# Patient Record
Sex: Female | Born: 1965 | Race: White | Hispanic: No | Marital: Married | Smoking: Never smoker
Health system: Southern US, Community
[De-identification: ages and names within clinical notes are randomized; demographics above are authoritative.]

## PROBLEM LIST (undated history)

## (undated) DIAGNOSIS — N393 Stress incontinence (female) (male): Secondary | ICD-10-CM

## (undated) DIAGNOSIS — F419 Anxiety disorder, unspecified: Secondary | ICD-10-CM

## (undated) DIAGNOSIS — I10 Essential (primary) hypertension: Secondary | ICD-10-CM

## (undated) HISTORY — PX: BREAST EXCISIONAL BIOPSY: SUR124

---

## 1995-10-28 HISTORY — PX: BREAST SURGERY: SHX581

## 2004-05-31 ENCOUNTER — Encounter: Admission: RE | Admit: 2004-05-31 | Discharge: 2004-05-31 | Payer: Self-pay | Admitting: Obstetrics and Gynecology

## 2004-11-12 ENCOUNTER — Other Ambulatory Visit: Admission: RE | Admit: 2004-11-12 | Discharge: 2004-11-12 | Payer: Self-pay | Admitting: Obstetrics and Gynecology

## 2004-11-13 ENCOUNTER — Encounter: Admission: RE | Admit: 2004-11-13 | Discharge: 2004-11-13 | Payer: Self-pay | Admitting: Obstetrics and Gynecology

## 2005-11-13 ENCOUNTER — Encounter: Payer: Self-pay | Admitting: Obstetrics and Gynecology

## 2005-11-13 ENCOUNTER — Encounter: Admission: RE | Admit: 2005-11-13 | Discharge: 2005-11-13 | Payer: Self-pay | Admitting: Obstetrics and Gynecology

## 2005-11-13 ENCOUNTER — Other Ambulatory Visit: Admission: RE | Admit: 2005-11-13 | Discharge: 2005-11-13 | Payer: Self-pay | Admitting: Obstetrics and Gynecology

## 2005-11-18 ENCOUNTER — Encounter: Admission: RE | Admit: 2005-11-18 | Discharge: 2005-11-18 | Payer: Self-pay | Admitting: Obstetrics and Gynecology

## 2006-12-01 ENCOUNTER — Encounter: Admission: RE | Admit: 2006-12-01 | Discharge: 2006-12-01 | Payer: Self-pay | Admitting: Obstetrics and Gynecology

## 2006-12-01 ENCOUNTER — Other Ambulatory Visit: Admission: RE | Admit: 2006-12-01 | Discharge: 2006-12-01 | Payer: Self-pay | Admitting: Obstetrics and Gynecology

## 2006-12-15 ENCOUNTER — Encounter: Admission: RE | Admit: 2006-12-15 | Discharge: 2006-12-15 | Payer: Self-pay | Admitting: Obstetrics and Gynecology

## 2007-05-10 ENCOUNTER — Encounter: Admission: RE | Admit: 2007-05-10 | Discharge: 2007-05-10 | Payer: Self-pay | Admitting: Obstetrics and Gynecology

## 2007-11-11 ENCOUNTER — Encounter: Admission: RE | Admit: 2007-11-11 | Discharge: 2007-11-11 | Payer: Self-pay | Admitting: Obstetrics and Gynecology

## 2007-12-14 ENCOUNTER — Other Ambulatory Visit: Admission: RE | Admit: 2007-12-14 | Discharge: 2007-12-14 | Payer: Self-pay | Admitting: Obstetrics and Gynecology

## 2008-11-13 ENCOUNTER — Encounter: Admission: RE | Admit: 2008-11-13 | Discharge: 2008-11-13 | Payer: Self-pay | Admitting: Obstetrics and Gynecology

## 2008-12-28 ENCOUNTER — Other Ambulatory Visit: Admission: RE | Admit: 2008-12-28 | Discharge: 2008-12-28 | Payer: Self-pay | Admitting: Obstetrics and Gynecology

## 2009-11-14 ENCOUNTER — Encounter: Admission: RE | Admit: 2009-11-14 | Discharge: 2009-11-14 | Payer: Self-pay | Admitting: Obstetrics and Gynecology

## 2010-01-01 ENCOUNTER — Other Ambulatory Visit: Admission: RE | Admit: 2010-01-01 | Discharge: 2010-01-01 | Payer: Self-pay | Admitting: Obstetrics and Gynecology

## 2010-11-15 ENCOUNTER — Encounter
Admission: RE | Admit: 2010-11-15 | Discharge: 2010-11-15 | Payer: Self-pay | Source: Home / Self Care | Attending: Obstetrics and Gynecology | Admitting: Obstetrics and Gynecology

## 2010-11-17 ENCOUNTER — Encounter: Payer: Self-pay | Admitting: Obstetrics and Gynecology

## 2012-02-16 ENCOUNTER — Encounter (HOSPITAL_BASED_OUTPATIENT_CLINIC_OR_DEPARTMENT_OTHER): Payer: Self-pay | Admitting: *Deleted

## 2012-02-16 ENCOUNTER — Emergency Department (HOSPITAL_BASED_OUTPATIENT_CLINIC_OR_DEPARTMENT_OTHER)
Admission: EM | Admit: 2012-02-16 | Discharge: 2012-02-16 | Disposition: A | Discharge status: Home / Self Care | Payer: BC Managed Care – PPO | Attending: Emergency Medicine | Admitting: Emergency Medicine

## 2012-02-16 DIAGNOSIS — M79609 Pain in unspecified limb: Secondary | ICD-10-CM | POA: Insufficient documentation

## 2012-02-16 DIAGNOSIS — W260XXA Contact with knife, initial encounter: Secondary | ICD-10-CM | POA: Insufficient documentation

## 2012-02-16 DIAGNOSIS — IMO0002 Reserved for concepts with insufficient information to code with codable children: Secondary | ICD-10-CM

## 2012-02-16 DIAGNOSIS — F411 Generalized anxiety disorder: Secondary | ICD-10-CM | POA: Insufficient documentation

## 2012-02-16 DIAGNOSIS — S61209A Unspecified open wound of unspecified finger without damage to nail, initial encounter: Secondary | ICD-10-CM | POA: Insufficient documentation

## 2012-02-16 HISTORY — DX: Anxiety disorder, unspecified: F41.9

## 2012-02-16 MED ORDER — TETANUS-DIPHTH-ACELL PERTUSSIS 5-2.5-18.5 LF-MCG/0.5 IM SUSP
0.5000 mL | Freq: Once | INTRAMUSCULAR | Status: AC
  Administered 2012-02-16: 0.5 mL via INTRAMUSCULAR
  Filled 2012-02-16: qty 0.5

## 2012-02-16 MED ORDER — LIDOCAINE HCL (PF) 1 % IJ SOLN
INTRAMUSCULAR | Status: AC
  Administered 2012-02-16: 08:00:00
  Filled 2012-02-16: qty 5

## 2012-02-16 MED ORDER — OXYCODONE-ACETAMINOPHEN 5-325 MG PO TABS
1.0000 | ORAL_TABLET | Freq: Once | ORAL | Status: AC
  Administered 2012-02-16: 1 via ORAL

## 2012-02-16 MED ORDER — LIDOCAINE HCL (PF) 1 % IJ SOLN: 2.0000 mL | Freq: Once | INTRAMUSCULAR | Status: DC

## 2012-02-16 MED ORDER — OXYCODONE-ACETAMINOPHEN 5-325 MG PO TABS
ORAL_TABLET | ORAL | Status: AC
  Administered 2012-02-16: 1 via ORAL
  Filled 2012-02-16: qty 1

## 2012-02-16 NOTE — ED Provider Notes (Signed)
History     CSN: [id number]  Arrival date & time 02/16/12  4098   First MD Initiated Contact with Patient 02/16/12 0732      Chief Complaint  Patient presents with  . Laceration    Patient is a 46 y.o. female presenting with skin laceration. The history is provided by the patient.  Laceration  The incident occurred 1 to 2 hours ago. Pain location: left thumb. The laceration is 1 cm in size. The laceration mechanism was a a clean knife. The pain is moderate. The pain has been constant since onset. Her tetanus status is unknown.  pt cut left thumb with knife while cutting bagel Reports bleeding wound and pain in the wound No other complaints are reported  Past Medical History  Diagnosis Date  . Anxiety     History reviewed. No pertinent past surgical history.  History reviewed. No pertinent family history.  History  Substance Use Topics  . Smoking status: Never Smoker   . Smokeless tobacco: Not on file  . Alcohol Use: 1.2 oz/week    2 Glasses of wine per week     per day    OB History    Grav Para Term Preterm Abortions TAB SAB Ect Mult Living                  Review of Systems  Constitutional: Negative for fever.  Skin: Positive for wound.    Allergies  Review of patient's allergies indicates no known allergies.  Home Medications   Current Outpatient Rx  Name Route Sig Dispense Refill  . VENLAFAXINE HCL ER 150 MG PO CP24 Oral Take 150 mg by mouth daily.      BP 142/91  Pulse 84  Temp(Src) 98 F (36.7 C) (Oral)  Resp 20  Ht 5\' 9"  (1.753 m)  Wt 145 lb (65.772 kg)  BMI 21.41 kg/m2  SpO2 100%  LMP 02/15/2012  Physical Exam CONSTITUTIONAL: Well developed/well nourished HEAD AND FACE: Normocephalic/atraumatic EYES: EOMI/PERRL ENMT: Mucous membranes moist NECK: supple no meningeal signs LUNGS:  no apparent distress ABDOMEN: soft NEURO: Pt is awake/alert, moves all extremitiesx4 EXTREMITIES: pulses normal, full ROM SKIN: warm, color normal,  laceration to distal tip of left thumb.  The nail also been disrupted but there is no subungual hematoma.  No active bleeding PSYCH: no abnormalities of mood noted  ED Course  NERVE BLOCK Date/Time: 02/16/2012 8:04 AM Performed by: Joya Gaskins Authorized by: Joya Gaskins Consent: Verbal consent obtained. Consent given by: patient Patient identity confirmed: verbally with patient Indications: pain relief Body area: upper extremity Nerve: digital Laterality: left Patient sedated: no Preparation: Patient was prepped and draped in the usual sterile fashion. Needle gauge: 24 G Local anesthetic: lidocaine 1% without epinephrine Anesthetic total: 3 ml Patient tolerance: Patient tolerated the procedure well with no immediate complications.    LACERATION REPAIR Performed by: Joya Gaskins Consent: Verbal consent obtained. Risks and benefits: risks, benefits and alternatives were discussed Patient identity confirmed: provided demographic data Time out performed prior to procedure Prepped and Draped in normal sterile fashion Wound explored  Laceration Location: left thumb  Laceration Length: 1cm  No Foreign Bodies seen or palpated  Anesthesia: local infiltration  Local anesthetic:nerveblock  Irrigation method: tap water Amount of cleaning: standard  Skin closure: simple  Number of sutures or staples: dermabond  Technique: dermabond  Patient tolerance: Patient tolerated the procedure well with no immediate complications.     MDM  Nursing notes reviewed and  considered in documentation         Joya Gaskins, MD 02/16/12 [id number]

## 2012-02-16 NOTE — ED Notes (Signed)
Cutting a bagel with a serrated knife cut finger

## 2012-02-16 NOTE — Discharge Instructions (Signed)
Laceration Care, Adult °A laceration is a cut that goes through all layers of the skin. The cut goes into the tissue beneath the skin. °HOME CARE °For stitches (sutures) or staples: °· Keep the cut clean and dry.  °· If you have a bandage (dressing), change it at least once a day. Change the bandage if it gets wet or dirty, or as told by your doctor.  °· Wash the cut with soap and water 2 times a day. Rinse the cut with water. Pat it dry with a clean towel.  °· Put a thin layer of medicated cream on the cut as told by your doctor.  °· You may shower after the first 24 hours. Do not soak the cut in water until the stitches are removed.  °· Only take medicines as told by your doctor.  °· Have your stitches or staples removed as told by your doctor.  °For skin adhesive strips: °· Keep the cut clean and dry.  °· Do not get the strips wet. You may take a bath, but be careful to keep the cut dry.  °· If the cut gets wet, pat it dry with a clean towel.  °· The strips will fall off on their own. Do not remove the strips that are still stuck to the cut.  °For wound glue: °· You may shower or take baths. Do not soak or scrub the cut. Do not swim. Avoid heavy sweating until the glue falls off on its own. After a shower or bath, pat the cut dry with a clean towel.  °· Do not put medicine on your cut until the glue falls off.  °· If you have a bandage, do not put tape over the glue.  °· Avoid lots of sunlight or tanning lamps until the glue falls off. Put sunscreen on the cut for the first year to reduce your scar.  °· The glue will fall off on its own. Do not pick at the glue.  °You may need a tetanus shot if: °· You cannot remember when you had your last tetanus shot.  °· You have never had a tetanus shot.  °If you need a tetanus shot and you choose not to have one, you may get tetanus. Sickness from tetanus can be serious. °GET HELP RIGHT AWAY IF:  °· Your pain does not get better with medicine.  °· Your arm, hand, leg, or  foot loses feeling (numbness) or changes color.  °· Your cut is bleeding.  °· Your joint feels weak, or you cannot use your joint.  °· You have painful lumps on your body.  °· Your cut is red, puffy (swollen), or painful.  °· You have a red line on the skin near the cut.  °· You have yellowish-white fluid (pus) coming from the cut.  °· You have a fever.  °· You have a bad smell coming from the cut or bandage.  °· Your cut breaks open before or after stitches are removed.  °· You notice something coming out of the cut, such as wood or glass.  °· You cannot move a finger or toe.  °MAKE SURE YOU:  °· Understand these instructions.  °· Will watch your condition.  °· Will get help right away if you are not doing well or get worse.  °Document Released: 03/31/2008 Document Revised: 10/02/2011 Document Reviewed: 04/08/2011 °ExitCare® Patient Information ©2012 [employer]. °

## 2013-02-08 ENCOUNTER — Other Ambulatory Visit: Payer: Self-pay | Admitting: Obstetrics and Gynecology

## 2013-02-08 DIAGNOSIS — R922 Inconclusive mammogram: Secondary | ICD-10-CM

## 2013-03-02 ENCOUNTER — Other Ambulatory Visit: Payer: BC Managed Care – PPO

## 2013-07-26 ENCOUNTER — Ambulatory Visit: Payer: BC Managed Care – PPO | Admitting: Family Medicine

## 2013-08-25 ENCOUNTER — Other Ambulatory Visit: Payer: Self-pay | Admitting: Dermatology

## 2014-10-16 ENCOUNTER — Encounter (HOSPITAL_BASED_OUTPATIENT_CLINIC_OR_DEPARTMENT_OTHER): Payer: Self-pay | Admitting: *Deleted

## 2014-10-17 ENCOUNTER — Encounter (HOSPITAL_BASED_OUTPATIENT_CLINIC_OR_DEPARTMENT_OTHER): Payer: Self-pay | Admitting: *Deleted

## 2014-10-17 NOTE — H&P (Signed)
  Patient name  Felicia Coleman, [place] DICTATION#  [id number] CSN# [id number]  Darlyn Chamber, MD 10/17/2014 2:50 PM

## 2014-10-17 NOTE — Progress Notes (Signed)
Pt instructed npo p mn 12/30.  To [facility] 12/31 @ 0600.  Needs istat, urine hcg on arrival. Awaiting Dr. Kayleen Memos.

## 2014-10-18 NOTE — H&P (Signed)
NAME:  Felicia Coleman, Felicia Coleman NO.:  [id number]  MEDICAL RECORD NO.:  [id number]  LOCATION:                                FACILITY:  [facility]  PHYSICIAN:  Darlyn Chamber, M.D.   DATE OF BIRTH:  [birth year]  DATE OF ADMISSION:  10/26/2014 DATE OF DISCHARGE:                             HISTORY & PHYSICAL   DATE OF SURGERY:  December 31st, at [facility] in [place].  HISTORY OF PRESENT ILLNESS:  The patient is a 48 year old, gravida 2, para 2 female, who comes in for a mid urethral sling for management of stress incontinence.  The patient has had trouble with worsening stress urinary incontinence when she cough, sneeze, or bears down.  This also limited her running.  The patient underwent urodynamic testing in the office showed a residual urine of 1-2 cm.  The patient did leak when coughing and sneezing.  She had normal leak point pressures.  Her urethral pressure profile was also normal.  There was no evidence of uninhibited bladder contractions.  We went over different options including physical therapy versus surgical management.  She has opted for surgery for which she comes in at the present time.  ALLERGIES:  She has no known drug allergies.  MEDICATIONS:  She is on venlafaxine 35 mg.  PAST MEDICAL HISTORY:  Usual childhood diseases.  No significant sequelae.  PAST SURGICAL HISTORY:  She has had a cesarean section and one vaginal delivery.  SOCIAL HISTORY:  Reveals no tobacco and minimal alcohol use.  FAMILY HISTORY:  Noncontributory.  REVIEW OF SYSTEMS:  Noncontributory.  PHYSICAL EXAMINATION:  VITAL SIGNS:  The patient is afebrile.  Stable vital signs. HEENT:  The patient is normocephalic.  Pupils equal, round, reactive to light and accommodation.  Extraocular movements were intact.  Sclerae and conjunctivae were clear.  Oropharynx is clear. NECK:  Without thyromegaly. BREASTS:  Not examined. LUNGS:  Clear. CARDIOVASCULAR SYSTEM:   Regular rhythm and rate.  There are no murmurs or gallops. ABDOMEN:  Benign.  No mass, organomegaly, or tenderness. PELVIC:  Normal external genitalia.  Vaginal mucosa is clear.  Mild cystourethrocele.  Cervix unremarkable.  Uterus; normal size, shape, and contour.  Adnexa free of mass or tenderness.  IMPRESSION:  Anatomical stress urinary incontinence.  PLAN:  The patient will undergo a mid urethral sling using a transobturator approach.  The success rates of 85% are quoted. Potential risks explained including the risk of infection and risk of hemorrhage that could require transfusion with the risk of AIDS or hepatitis, risk of injury to adjacent organs, this could include bladder, urethral, or ureters that could require further exploratory surgery.  Risk of deep venous thrombosis and pulmonary embolus.  With mesh, there is a risk of erosion that could require further surgical management.  There is a risk of mesh rejection leading to chronic pelvic pain, the risk of developing insertional dyspareunia.  Finally, if we overtighten the mesh, this can lead to an obstructive voiding pattern which will require loosening of the mesh that could have a return of incontinence.  Lastly, there is a risk of bladder spasms that require medical therapy.  The patient  expressed understanding of indications, risks, and other alternatives.     Darlyn Chamber, M.D.     JSM/MEDQ  D:  10/17/2014  T:  10/17/2014  Job:  [id number]

## 2014-10-26 ENCOUNTER — Ambulatory Visit (HOSPITAL_BASED_OUTPATIENT_CLINIC_OR_DEPARTMENT_OTHER)
Admission: RE | Admit: 2014-10-26 | Payer: BC Managed Care – PPO | Source: Ambulatory Visit | Admitting: Obstetrics and Gynecology

## 2014-10-26 HISTORY — DX: Essential (primary) hypertension: I10

## 2014-10-26 HISTORY — DX: Stress incontinence (female) (male): N39.3

## 2014-10-26 SURGERY — CREATION, PUBOVAGINAL SLING
Anesthesia: Choice

## 2015-04-11 ENCOUNTER — Other Ambulatory Visit: Payer: Self-pay | Admitting: Obstetrics and Gynecology

## 2015-04-12 LAB — CYTOLOGY - PAP

## 2018-08-11 ENCOUNTER — Other Ambulatory Visit: Payer: Self-pay | Admitting: Obstetrics and Gynecology

## 2018-08-11 DIAGNOSIS — Z803 Family history of malignant neoplasm of breast: Secondary | ICD-10-CM

## 2019-08-04 ENCOUNTER — Other Ambulatory Visit: Payer: Self-pay | Admitting: Obstetrics and Gynecology

## 2019-08-04 DIAGNOSIS — R928 Other abnormal and inconclusive findings on diagnostic imaging of breast: Secondary | ICD-10-CM

## 2019-08-15 ENCOUNTER — Ambulatory Visit: Payer: BC Managed Care – PPO

## 2019-08-15 ENCOUNTER — Other Ambulatory Visit: Payer: Self-pay

## 2019-08-15 ENCOUNTER — Ambulatory Visit
Admission: RE | Admit: 2019-08-15 | Discharge: 2019-08-15 | Disposition: A | Discharge status: Home / Self Care | Payer: BC Managed Care – PPO | Source: Ambulatory Visit | Attending: Obstetrics and Gynecology | Admitting: Obstetrics and Gynecology

## 2019-08-15 DIAGNOSIS — R928 Other abnormal and inconclusive findings on diagnostic imaging of breast: Secondary | ICD-10-CM

## 2019-11-08 ENCOUNTER — Other Ambulatory Visit: Payer: Self-pay

## 2019-11-08 ENCOUNTER — Ambulatory Visit: Payer: BC Managed Care – PPO | Admitting: Plastic Surgery

## 2019-11-08 ENCOUNTER — Encounter: Payer: Self-pay | Admitting: Plastic Surgery

## 2019-11-08 DIAGNOSIS — D2230 Melanocytic nevi of unspecified part of face: Secondary | ICD-10-CM

## 2019-11-08 NOTE — Progress Notes (Signed)
Patient ID: Felicia Coleman, female    DOB: 1966-02-05, 55 y.o.   MRN: [id number]   Chief Complaint  Patient presents with  . Skin Problem    The patient is a 54 year old female here with her husband for evaluation of her nose.  She has a history of skin lesions.  She sees the dermatologist regularly.  She was seen recently and concerned about a lesion on her nose.  Is located at the dorsum and is a little irritated.  This may be related to rubbing from the mask.  There is a little bit of redness and a little ulceration in the middle of it.  It is about 7 to 8 mm in size.  Does not appear to be infected just irritated.  It is hard to tell anything about the color since it is red at this time.  She is otherwise in good health.   Review of Systems  Constitutional: Negative.  Negative for activity change.  HENT: Negative.   Eyes: Negative.   Respiratory: Negative.  Negative for chest tightness.   Cardiovascular: Negative.   Gastrointestinal: Negative.   Genitourinary: Negative.   Musculoskeletal: Negative.   Skin: Positive for color change.  Hematological: Negative.   Psychiatric/Behavioral: Negative.     Past Medical History:  Diagnosis Date  . Anxiety   . Hypertension   . SUI (stress urinary incontinence, female)     Past Surgical History:  Procedure Laterality Date  . BREAST EXCISIONAL BIOPSY Right   . BREAST SURGERY  1997   rt lumpectomy      Current Outpatient Medications:  .  beta carotene w/minerals (OCUVITE) tablet, Take 1 tablet by mouth daily., Disp: , Rfl:  .  hydrochlorothiazide (HYDRODIURIL) 25 MG tablet, Take 25 mg by mouth daily., Disp: , Rfl:  .  venlafaxine XR (EFFEXOR-XR) 150 MG 24 hr capsule, Take 150 mg by mouth daily., Disp: , Rfl:    Objective:   Vitals:   11/08/19 1515  BP: (!) 156/101  Pulse: 78  Temp: 97.7 F (36.5 C)  SpO2: 99%    Physical Exam Vitals and nursing note reviewed.  Constitutional:      Appearance: Normal appearance.    HENT:     Head: Normocephalic and atraumatic.   Cardiovascular:     Rate and Rhythm: Normal rate.  Pulmonary:     Effort: Pulmonary effort is normal.  Skin:    General: Skin is warm.  Neurological:     General: No focal deficit present.     Mental Status: She is alert and oriented to person, place, and time.  Psychiatric:        Mood and Affect: Mood normal.        Behavior: Behavior normal.        Thought Content: Thought content normal.     Assessment & Plan:  Melanocytic nevus of face  Recommend biopsy of the area to be sure of what it is because of the irritation.  We discussed the options may include Mohs surgery or excision by me.  We will evaluate once we get the biopsy.  Pictures were obtained of the patient and placed in the chart with the patient's or guardian's permission.   Wallace Going, DO   The 21st Century Cures Act was signed into law in 2016 which includes the topic of electronic health records.  This provides immediate access to information in MyChart.  This includes consultation notes, operative notes, office notes,  lab results and pathology reports.  If you have any questions about what you read please let us know at your next visit or call us at the office.  We are right here with you.

## 2019-12-23 ENCOUNTER — Encounter: Payer: Self-pay | Admitting: Plastic Surgery

## 2019-12-23 ENCOUNTER — Other Ambulatory Visit (HOSPITAL_COMMUNITY)
Admission: RE | Admit: 2019-12-23 | Discharge: 2019-12-23 | Disposition: A | Discharge status: Home / Self Care | Payer: BC Managed Care – PPO | Source: Ambulatory Visit | Attending: Plastic Surgery | Admitting: Plastic Surgery

## 2019-12-23 ENCOUNTER — Ambulatory Visit (INDEPENDENT_AMBULATORY_CARE_PROVIDER_SITE_OTHER): Payer: BC Managed Care – PPO | Admitting: Plastic Surgery

## 2019-12-23 ENCOUNTER — Other Ambulatory Visit: Payer: Self-pay

## 2019-12-23 VITALS — BP 112/78 | HR 96 | Temp 97.5°F | Ht 69.0 in | Wt 176.6 lb

## 2019-12-23 DIAGNOSIS — D2230 Melanocytic nevi of unspecified part of face: Secondary | ICD-10-CM | POA: Diagnosis present

## 2019-12-23 NOTE — Progress Notes (Signed)
Procedure Note  Preoperative Dx: changing skin lesion  Postoperative Dx: Same  Procedure: Excision of changing skin lesion 6 x 6 mm  Anesthesia: Lidocaine 1% with 1:100,000 epinepherine   Description of Procedure: Risks and complications were explained to the patient.  Consent was confirmed and the patient understands the risks and benefits.  The potential complications and alternatives were explained and the patient consents.  The patient expressed understanding the option of not having the procedure and the risks of a scar.  Time out was called and all information was confirmed to be correct.    The area was prepped and drapped.  Lidocaine 1% with epinepherine was injected in the subcutaneous area.  After waiting several minutes for the local to take affect a #15 blade was used to excise the area in an eliptical pattern.  A 5-0 Monocryl was used to close the skin edges.  A dressing was applied.  The patient was given instructions on how to care for the area and a follow up appointment.  Felicia Coleman tolerated the procedure well and there were no complications. The specimen was sent to pathology.

## 2019-12-24 ENCOUNTER — Ambulatory Visit: Payer: BC Managed Care – PPO | Attending: Internal Medicine

## 2019-12-24 DIAGNOSIS — Z23 Encounter for immunization: Secondary | ICD-10-CM | POA: Insufficient documentation

## 2019-12-24 NOTE — Progress Notes (Signed)
   Covid-19 Vaccination Clinic  Name:  Felicia Coleman    MRN: [id number] DOB: 05-02-1966  12/24/2019  Felicia Coleman was observed post Covid-19 immunization for 15 minutes without incidence. She was provided with Vaccine Information Sheet and instruction to access the V-Safe system.   Felicia Coleman was instructed to call 911 with any severe reactions post vaccine: Marland Kitchen Difficulty breathing  . Swelling of your face and throat  . A fast heartbeat  . A bad rash all over your body  . Dizziness and weakness    Immunizations Administered    Name Date Dose VIS Date Route   Pfizer COVID-19 Vaccine 12/24/2019 11:05 AM 0.3 mL 10/07/2019 Intramuscular   Manufacturer: [place]   Lot: [id number]   [place]: [id number]

## 2019-12-26 LAB — SURGICAL PATHOLOGY

## 2020-01-02 NOTE — Progress Notes (Signed)
The patient is a 54 year old female here with her husband for follow-up after undergoing excision of a nasal lesion. The pathology showed a [place].  The peripheral margins are involved.  She would like to have it excised via Mohs technique.  This is a great idea.  We will make the referral.

## 2020-01-03 ENCOUNTER — Ambulatory Visit: Payer: BC Managed Care – PPO | Admitting: Plastic Surgery

## 2020-01-03 ENCOUNTER — Other Ambulatory Visit: Payer: Self-pay

## 2020-01-03 ENCOUNTER — Encounter: Payer: Self-pay | Admitting: Plastic Surgery

## 2020-01-03 DIAGNOSIS — C44311 Basal cell carcinoma of skin of nose: Secondary | ICD-10-CM

## 2020-01-14 ENCOUNTER — Ambulatory Visit: Payer: BC Managed Care – PPO | Attending: Internal Medicine

## 2020-01-14 DIAGNOSIS — Z23 Encounter for immunization: Secondary | ICD-10-CM

## 2020-01-14 NOTE — Progress Notes (Signed)
   Covid-19 Vaccination Clinic  Name:  Felicia Coleman    MRN: [id number] DOB: 28-Aug-1966  01/14/2020  [name] was observed post Covid-19 immunization for 15 minutes without incident. She was provided with Vaccine Information Sheet and instruction to access the V-Safe system.   [name] was instructed to call 911 with any severe reactions post vaccine: Marland Kitchen Difficulty breathing  . Swelling of face and throat  . A fast heartbeat  . A bad rash all over body  . Dizziness and weakness   Immunizations Administered    Name Date Dose VIS Date Route   Pfizer COVID-19 Vaccine 01/14/2020  1:21 PM 0.3 mL 10/07/2019 Intramuscular   Manufacturer: [place]   Lot: [id number]   [place]: [id number]

## 2020-01-18 ENCOUNTER — Ambulatory Visit: Payer: BC Managed Care – PPO

## 2020-02-20 ENCOUNTER — Encounter (HOSPITAL_BASED_OUTPATIENT_CLINIC_OR_DEPARTMENT_OTHER): Payer: Self-pay | Admitting: Plastic Surgery

## 2020-02-20 ENCOUNTER — Other Ambulatory Visit: Payer: Self-pay

## 2020-02-21 ENCOUNTER — Other Ambulatory Visit (HOSPITAL_COMMUNITY)
Admission: RE | Admit: 2020-02-21 | Discharge: 2020-02-21 | Disposition: A | Discharge status: Home / Self Care | Payer: BC Managed Care – PPO | Source: Ambulatory Visit | Attending: Plastic Surgery | Admitting: Plastic Surgery

## 2020-02-21 ENCOUNTER — Encounter (HOSPITAL_BASED_OUTPATIENT_CLINIC_OR_DEPARTMENT_OTHER)
Admission: RE | Admit: 2020-02-21 | Discharge: 2020-02-21 | Disposition: A | Discharge status: Home / Self Care | Payer: BC Managed Care – PPO | Source: Ambulatory Visit

## 2020-02-21 ENCOUNTER — Ambulatory Visit: Payer: BC Managed Care – PPO | Admitting: Plastic Surgery

## 2020-02-21 ENCOUNTER — Other Ambulatory Visit: Payer: Self-pay

## 2020-02-21 DIAGNOSIS — Z20822 Contact with and (suspected) exposure to covid-19: Secondary | ICD-10-CM | POA: Diagnosis not present

## 2020-02-21 DIAGNOSIS — Z01812 Encounter for preprocedural laboratory examination: Secondary | ICD-10-CM | POA: Insufficient documentation

## 2020-02-21 LAB — BASIC METABOLIC PANEL
Anion gap: 10 (ref 5–15)
BUN: 13 mg/dL (ref 6–20)
CO2: 26 mmol/L (ref 22–32)
Calcium: 9.3 mg/dL (ref 8.9–10.3)
Chloride: 102 mmol/L (ref 98–111)
Creatinine, Ser: 0.83 mg/dL (ref 0.44–1.00)
GFR calc Af Amer: >60 mL/min (ref >60)
GFR calc non Af Amer: >60 mL/min (ref >60)
Glucose, Bld: 120 mg/dL — ABNORMAL HIGH (ref 70–99)
Potassium: 4.1 mmol/L (ref 3.5–5.1)
Sodium: 138 mmol/L (ref 135–145)

## 2020-02-21 LAB — POCT PREGNANCY, URINE: Preg Test, Ur: NEGATIVE

## 2020-02-21 LAB — SARS CORONAVIRUS 2 (TAT 6-24 HRS): SARS Coronavirus 2: NEGATIVE

## 2020-02-22 ENCOUNTER — Encounter (HOSPITAL_BASED_OUTPATIENT_CLINIC_OR_DEPARTMENT_OTHER)
Admission: RE | Disposition: A | Discharge status: Home / Self Care | Payer: Self-pay | Source: Home / Self Care | Attending: Plastic Surgery

## 2020-02-22 ENCOUNTER — Other Ambulatory Visit: Payer: Self-pay

## 2020-02-22 ENCOUNTER — Ambulatory Visit (HOSPITAL_BASED_OUTPATIENT_CLINIC_OR_DEPARTMENT_OTHER): Payer: BC Managed Care – PPO | Admitting: Certified Registered"

## 2020-02-22 ENCOUNTER — Encounter (HOSPITAL_BASED_OUTPATIENT_CLINIC_OR_DEPARTMENT_OTHER): Payer: Self-pay | Admitting: Plastic Surgery

## 2020-02-22 ENCOUNTER — Ambulatory Visit (HOSPITAL_BASED_OUTPATIENT_CLINIC_OR_DEPARTMENT_OTHER)
Admission: RE | Admit: 2020-02-22 | Discharge: 2020-02-22 | Disposition: A | Discharge status: Home / Self Care | Payer: BC Managed Care – PPO | Attending: Plastic Surgery | Admitting: Plastic Surgery

## 2020-02-22 DIAGNOSIS — I10 Essential (primary) hypertension: Secondary | ICD-10-CM | POA: Insufficient documentation

## 2020-02-22 DIAGNOSIS — Z79899 Other long term (current) drug therapy: Secondary | ICD-10-CM | POA: Insufficient documentation

## 2020-02-22 DIAGNOSIS — M950 Acquired deformity of nose: Secondary | ICD-10-CM | POA: Insufficient documentation

## 2020-02-22 DIAGNOSIS — C44311 Basal cell carcinoma of skin of nose: Secondary | ICD-10-CM

## 2020-02-22 DIAGNOSIS — F419 Anxiety disorder, unspecified: Secondary | ICD-10-CM | POA: Diagnosis not present

## 2020-02-22 DIAGNOSIS — Z85828 Personal history of other malignant neoplasm of skin: Secondary | ICD-10-CM | POA: Diagnosis not present

## 2020-02-22 HISTORY — PX: ADJACENT TISSUE TRANSFER/TISSUE REARRANGEMENT: SHX6829

## 2020-02-22 SURGERY — ADJACENT TISSUE TRANSFER
Anesthesia: General | Site: Nose

## 2020-02-22 MED ORDER — OXYCODONE HCL 5 MG PO TABS: 5.0000 mg | ORAL_TABLET | ORAL | Status: DC | PRN

## 2020-02-22 MED ORDER — ONDANSETRON HCL 4 MG PO TABS: 4.0000 mg | ORAL_TABLET | Freq: Every day | ORAL | 0 refills | Status: AC | PRN

## 2020-02-22 MED ORDER — LACTATED RINGERS IV SOLN: INTRAVENOUS | Status: DC

## 2020-02-22 MED ORDER — MIDAZOLAM HCL 2 MG/2ML IJ SOLN
INTRAMUSCULAR | Status: AC
  Filled 2020-02-22: qty 2

## 2020-02-22 MED ORDER — ACETAMINOPHEN 80 MG RE SUPP: 650.0000 mg | RECTAL | Status: DC | PRN

## 2020-02-22 MED ORDER — CEFAZOLIN SODIUM-DEXTROSE 2-4 GM/100ML-% IV SOLN
2.0000 g | INTRAVENOUS | Status: AC
  Administered 2020-02-22: 15:00:00 2 g via INTRAVENOUS

## 2020-02-22 MED ORDER — MORPHINE SULFATE (PF) 4 MG/ML IV SOLN
INTRAVENOUS | Status: AC
  Filled 2020-02-22: qty 1

## 2020-02-22 MED ORDER — PROPOFOL 10 MG/ML IV BOLUS
INTRAVENOUS | Status: DC | PRN
  Administered 2020-02-22: 200 mg via INTRAVENOUS
  Administered 2020-02-22: 150 mg via INTRAVENOUS

## 2020-02-22 MED ORDER — LIDOCAINE HCL (CARDIAC) PF 100 MG/5ML IV SOSY
PREFILLED_SYRINGE | INTRAVENOUS | Status: DC | PRN
  Administered 2020-02-22: 60 mg via INTRAVENOUS

## 2020-02-22 MED ORDER — LIDOCAINE-EPINEPHRINE 1 %-1:100000 IJ SOLN
INTRAMUSCULAR | Status: DC | PRN
  Administered 2020-02-22: .5 mL

## 2020-02-22 MED ORDER — AMOXICILLIN-POT CLAVULANATE 500-125 MG PO TABS: 1.0000 | ORAL_TABLET | Freq: Three times a day (TID) | ORAL | Status: DC

## 2020-02-22 MED ORDER — MEPERIDINE HCL 25 MG/ML IJ SOLN: 6.2500 mg | INTRAMUSCULAR | Status: DC | PRN

## 2020-02-22 MED ORDER — ONDANSETRON HCL 4 MG/2ML IJ SOLN
INTRAMUSCULAR | Status: DC | PRN
  Administered 2020-02-22: 4 mg via INTRAVENOUS

## 2020-02-22 MED ORDER — SODIUM CHLORIDE 0.9% FLUSH: 3.0000 mL | Freq: Two times a day (BID) | INTRAVENOUS | Status: DC

## 2020-02-22 MED ORDER — OXYCODONE HCL 5 MG PO TABS
ORAL_TABLET | ORAL | Status: AC
  Filled 2020-02-22: qty 1

## 2020-02-22 MED ORDER — OXYCODONE HCL 5 MG PO TABS
5.0000 mg | ORAL_TABLET | Freq: Once | ORAL | Status: AC | PRN
  Administered 2020-02-22: 5 mg via ORAL

## 2020-02-22 MED ORDER — SODIUM CHLORIDE 0.9 % IV SOLN: 250.0000 mL | INTRAVENOUS | Status: DC | PRN

## 2020-02-22 MED ORDER — PROMETHAZINE HCL 25 MG/ML IJ SOLN: 6.2500 mg | INTRAMUSCULAR | Status: DC | PRN

## 2020-02-22 MED ORDER — MIDAZOLAM HCL 5 MG/5ML IJ SOLN
INTRAMUSCULAR | Status: DC | PRN
  Administered 2020-02-22: 2 mg via INTRAVENOUS

## 2020-02-22 MED ORDER — HYDROMORPHONE HCL 1 MG/ML IJ SOLN
INTRAMUSCULAR | Status: AC
  Filled 2020-02-22: qty 0.5

## 2020-02-22 MED ORDER — SODIUM CHLORIDE 0.9% FLUSH: 3.0000 mL | INTRAVENOUS | Status: DC | PRN

## 2020-02-22 MED ORDER — MORPHINE SULFATE (PF) 4 MG/ML IV SOLN
2.0000 mg | INTRAVENOUS | Status: DC | PRN
  Administered 2020-02-22: 2 mg via INTRAVENOUS

## 2020-02-22 MED ORDER — ACETAMINOPHEN 325 MG PO TABS: 650.0000 mg | ORAL_TABLET | ORAL | Status: DC | PRN

## 2020-02-22 MED ORDER — LIDOCAINE-EPINEPHRINE 1 %-1:100000 IJ SOLN
INTRAMUSCULAR | Status: AC
  Filled 2020-02-22: qty 1

## 2020-02-22 MED ORDER — HYDROCODONE-ACETAMINOPHEN 5-325 MG PO TABS: 1.0000 | ORAL_TABLET | Freq: Four times a day (QID) | ORAL | 0 refills | Status: AC | PRN

## 2020-02-22 MED ORDER — HYDROMORPHONE HCL 1 MG/ML IJ SOLN
0.2500 mg | INTRAMUSCULAR | Status: DC | PRN
  Administered 2020-02-22: 0.5 mg via INTRAVENOUS

## 2020-02-22 MED ORDER — OXYCODONE HCL 5 MG/5ML PO SOLN: 5.0000 mg | Freq: Once | ORAL | Status: AC | PRN

## 2020-02-22 MED ORDER — FENTANYL CITRATE (PF) 100 MCG/2ML IJ SOLN
INTRAMUSCULAR | Status: AC
Start: 2020-02-22 — End: ?
  Filled 2020-02-22: qty 2

## 2020-02-22 MED ORDER — DEXAMETHASONE SODIUM PHOSPHATE 10 MG/ML IJ SOLN
INTRAMUSCULAR | Status: DC | PRN
  Administered 2020-02-22: 5 mg via INTRAVENOUS

## 2020-02-22 MED ORDER — CEFAZOLIN SODIUM-DEXTROSE 2-4 GM/100ML-% IV SOLN
INTRAVENOUS | Status: AC
  Filled 2020-02-22: qty 100

## 2020-02-22 MED ORDER — FENTANYL CITRATE (PF) 100 MCG/2ML IJ SOLN
INTRAMUSCULAR | Status: DC | PRN
  Administered 2020-02-22: 50 ug via INTRAVENOUS

## 2020-02-22 SURGICAL SUPPLY — 45 items
BALL CTTN LRG ABS STRL LF (GAUZE/BANDAGES/DRESSINGS)
BLADE HEX COATED 2.75 (ELECTRODE) IMPLANT
CANISTER SUCT 1200ML W/VALVE (MISCELLANEOUS) IMPLANT
CLOSURE WOUND 1/2 X4 (GAUZE/BANDAGES/DRESSINGS) ×1
COTTONBALL LRG STERILE PKG (GAUZE/BANDAGES/DRESSINGS) IMPLANT
COVER WAND RF STERILE (DRAPES) IMPLANT
DECANTER SPIKE VIAL GLASS SM (MISCELLANEOUS) IMPLANT
DERMABOND ADVANCED (GAUZE/BANDAGES/DRESSINGS)
DERMABOND ADVANCED .7 DNX12 (GAUZE/BANDAGES/DRESSINGS) IMPLANT
ELECT COATED BLADE 2.86 ST (ELECTRODE) IMPLANT
ELECT NEEDLE BLADE 2-5/6 (NEEDLE) IMPLANT
ELECT REM PT RETURN 9FT ADLT (ELECTROSURGICAL) ×3
ELECTRODE REM PT RTRN 9FT ADLT (ELECTROSURGICAL) ×1 IMPLANT
GAUZE VASELINE FOILPK 1/2 X 72 (GAUZE/BANDAGES/DRESSINGS) IMPLANT
GAUZE XEROFORM 1X8 LF (GAUZE/BANDAGES/DRESSINGS) IMPLANT
GAUZE XEROFORM 5X9 LF (GAUZE/BANDAGES/DRESSINGS) IMPLANT
GLOVE BIO SURGEON STRL SZ 6.5 (GLOVE) ×4 IMPLANT
GLOVE BIO SURGEONS STRL SZ 6.5 (GLOVE) ×2
GOWN STRL REUS W/ TWL LRG LVL3 (GOWN DISPOSABLE) ×3 IMPLANT
GOWN STRL REUS W/TWL LRG LVL3 (GOWN DISPOSABLE) ×9
NEEDLE PRECISIONGLIDE 27X1.5 (NEEDLE) ×3 IMPLANT
PACK ENT DAY SURGERY (CUSTOM PROCEDURE TRAY) ×3 IMPLANT
PATTIES SURGICAL .5 X3 (DISPOSABLE) IMPLANT
PENCIL SMOKE EVACUATOR (MISCELLANEOUS) ×3 IMPLANT
SET BASIN DAY SURGERY F.S. (CUSTOM PROCEDURE TRAY) ×3 IMPLANT
SLEEVE SCD COMPRESS KNEE MED (MISCELLANEOUS) ×3 IMPLANT
SPONGE GAUZE 2X2 8PLY STER LF (GAUZE/BANDAGES/DRESSINGS)
SPONGE GAUZE 2X2 8PLY STRL LF (GAUZE/BANDAGES/DRESSINGS) IMPLANT
STRIP CLOSURE SKIN 1/2X4 (GAUZE/BANDAGES/DRESSINGS) ×2 IMPLANT
SUT MNCRL 6-0 UNDY P1 1X18 (SUTURE) IMPLANT
SUT MNCRL AB 3-0 PS2 18 (SUTURE) IMPLANT
SUT MNCRL AB 4-0 PS2 18 (SUTURE) IMPLANT
SUT MON AB 4-0 PC3 18 (SUTURE) IMPLANT
SUT MON AB 5-0 P3 18 (SUTURE) ×3 IMPLANT
SUT MON AB 5-0 PS2 18 (SUTURE) IMPLANT
SUT MONOCRYL 6-0 P1 1X18 (SUTURE)
SUT PROLENE 4 0 P 3 18 (SUTURE) ×3 IMPLANT
SUT PROLENE 5 0 P 3 (SUTURE) IMPLANT
SUT PROLENE 5 0 PS 2 (SUTURE) ×3 IMPLANT
SUT PROLENE 6 0 P 1 18 (SUTURE) ×3 IMPLANT
SUT VIC AB 5-0 PS2 18 (SUTURE) IMPLANT
SUT VICRYL 4-0 PS2 18IN ABS (SUTURE) ×3 IMPLANT
SYR BULB EAR ULCER 3OZ GRN STR (SYRINGE) IMPLANT
TOWEL GREEN STERILE FF (TOWEL DISPOSABLE) ×3 IMPLANT
TRAY DSU PREP LF (CUSTOM PROCEDURE TRAY) ×3 IMPLANT

## 2020-02-22 NOTE — Discharge Instructions (Addendum)
INSTRUCTIONS FOR AFTER SURGERY   You will likely have some questions about what to expect following your operation.  The following information will help you and your family understand what to expect when you are discharged from the hospital.  Following these guidelines will help ensure a smooth recovery and reduce risks of complications.  Postoperative instructions include information on: diet, wound care, medications and physical activity.  AFTER SURGERY Expect to go home after the procedure.  In some cases, you may need to spend one night in the hospital for observation.  DIET This surgery does not require a specific diet.  However, I have to mention that the healthier you eat the better your body can start healing. It is important to increasing your protein intake.  This means limiting the foods with added sugar.  Focus on fruits and vegetables and some meat.  If you have any liposuction during your procedure be sure to drink water.  If your urine is bright yellow, then it is concentrated, and you need to drink more water.  As a general rule after surgery, you should have 8 ounces of water every hour while awake.  If you find you are persistently nauseated or unable to take in liquids let Felicia Coleman know.  NO TOBACCO USE or EXPOSURE.  This will slow your healing process and increase the risk of a wound.  WOUND CARE If you don't have a drain: You can shower the day after surgery.  Use fragrance free soap.  Dial, [place], [place] and Cetaphil are usually mild on the skin.    We close your incision to leave the smallest and best-looking scar. No ointment or creams on your incisions until given the go ahead.  Especially not Neosporin (Too many skin reactions with this one).  A few weeks after surgery you can use Mederma and start massaging the scar.  ACTIVITY No heavy lifting until cleared by the doctor.  It is OK to walk and climb stairs. In fact, moving your legs is very important to decrease your risk of a blood  clot.  It will also help keep you from getting deconditioned.  Every 1 to 2 hours get up and walk for 5 minutes. This will help with a quicker recovery back to normal.  Let pain be your guide so you don't do too much.  NO, you cannot do the spring cleaning and don't plan on taking care of anyone else.  This is your time for TLC.   WORK Everyone returns to work at different times. As a rough guide, most people take at least 1 - 2 weeks off prior to returning to work. If you need documentation for your job, bring the forms to your postoperative follow up visit.  DRIVING Arrange for someone to bring you home from the hospital.  You may be able to drive a few days after surgery but not while taking any narcotics or valium.  BOWEL MOVEMENTS Constipation can occur after anesthesia and while taking pain medication.  It is important to stay ahead for your comfort.  We recommend taking Milk of Magnesia (2 tablespoons; twice a day) while taking the pain pills.  SEROMA This is fluid your body tried to put in the surgical site.  This is normal but if it creates excessive pain and swelling let Felicia Coleman know.  It usually decreases in a few weeks.  MEDICATIONS and PAIN CONTROL At your preoperative visit for you history and physical you were given the following medications: 1.  An antibiotic: Start this medication when you get home and take according to the instructions on the bottle. 2. Zofran 4 mg:  This is to treat nausea and vomiting.  You can take this every 6 hours as needed and only if needed. 3. Norco (hydrocodone/acetaminophen) 5/325 mg:  This is only to be used after you have taken the motrin or the tylenol. Every 8 hours as needed. Over the counter Medication to take: 4. Ibuprofen (Motrin) 600 mg:  Take this every 6 hours.  If you have additional pain then take 500 mg of the tylenol.  Only take the Norco after you have tried these two. 5. Miralax or stool softener of choice: Take this according to the  bottle if you take the [place] Call your surgeon's office if any of the following occur: . Fever 101 degrees F or greater . Excessive bleeding or fluid from the incision site. . Pain that increases over time without aid from the medications . Redness, warmth, or pus draining from incision sites . Persistent nausea or inability to take in liquids . Severe misshapen area that underwent the operation.   Post Anesthesia Home Care Instructions  Activity: Get plenty of rest for the remainder of the day. A responsible individual must stay with you for 24 hours following the procedure.  For the next 24 hours, DO NOT: -Drive a car -Paediatric nurse -Drink alcoholic beverages -Take any medication unless instructed by your physician -Make any legal decisions or sign important papers.  Meals: Start with liquid foods such as gelatin or soup. Progress to regular foods as tolerated. Avoid greasy, spicy, heavy foods. If nausea and/or vomiting occur, drink only clear liquids until the nausea and/or vomiting subsides. Call your physician if vomiting continues.  Special Instructions/Symptoms: Your throat may feel dry or sore from the anesthesia or the breathing tube placed in your throat during surgery. If this causes discomfort, gargle with warm salt water. The discomfort should disappear within 24 hours.     *Oxycodone given at 4:15 pm today

## 2020-02-22 NOTE — Anesthesia Preprocedure Evaluation (Signed)
Anesthesia Evaluation  Patient identified by MRN, date of birth, ID band Patient awake    Reviewed: Allergy & Precautions, NPO status , Patient's Chart, lab work & pertinent test results  Airway Mallampati: II  TM Distance: >3 FB Neck ROM: Full    Dental no notable dental hx.    Pulmonary neg pulmonary ROS,    Pulmonary exam normal breath sounds clear to auscultation       Cardiovascular hypertension, Pt. on medications negative cardio ROS Normal cardiovascular exam Rhythm:Regular Rate:Normal     Neuro/Psych Anxiety negative neurological ROS  negative psych ROS   GI/Hepatic negative GI ROS, Neg liver ROS,   Endo/Other  negative endocrine ROS  Renal/GU negative Renal ROS  negative genitourinary   Musculoskeletal negative musculoskeletal ROS (+)   Abdominal   Peds negative pediatric ROS (+)  Hematology negative hematology ROS (+)   Anesthesia Other Findings Basal Cell Carcinoma  Reproductive/Obstetrics negative OB ROS                             Anesthesia Physical Anesthesia Plan  ASA: II  Anesthesia Plan: General   Post-op Pain Management:    Induction: Intravenous  PONV Risk Score and Plan: 3 and Ondansetron, Dexamethasone, Midazolam and Treatment may vary due to age or medical condition  Airway Management Planned: LMA  Additional Equipment:   Intra-op Plan:   Post-operative Plan: Extubation in OR  Informed Consent: I have reviewed the patients History and Physical, chart, labs and discussed the procedure including the risks, benefits and alternatives for the proposed anesthesia with the patient or authorized representative who has indicated his/her understanding and acceptance.     Dental advisory given  Plan Discussed with: CRNA  Anesthesia Plan Comments:         Anesthesia Quick Evaluation

## 2020-02-22 NOTE — Anesthesia Procedure Notes (Signed)
Procedure Name: LMA Insertion Date/Time: 02/22/2020 3:00 PM Performed by: Lavonia Dana, CRNA Pre-anesthesia Checklist: Patient identified, Emergency Drugs available, Suction available and Patient being monitored Patient Re-evaluated:Patient Re-evaluated prior to induction Oxygen Delivery Method: Circle system utilized Preoxygenation: Pre-oxygenation with 100% oxygen Induction Type: IV induction Ventilation: Mask ventilation without difficulty LMA: LMA inserted LMA Size: 4.0 Number of attempts: 1 Airway Equipment and Method: Bite block Placement Confirmation: positive ETCO2 Tube secured with: Tape Dental Injury: Teeth and Oropharynx as per pre-operative assessment

## 2020-02-22 NOTE — H&P (Signed)
Felicia Coleman is an 54 y.o. female.   Chief Complaint: Mohs defect of nose HPI: Patient is a 54 year old female here for treatment of her nose defect.  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  It was excised by the Mohs surgeon.  She presents for closure.  Past Medical History:  Diagnosis Date  . Anxiety   . Hypertension   . SUI (stress urinary incontinence, female)     Past Surgical History:  Procedure Laterality Date  . BREAST EXCISIONAL BIOPSY Right   . BREAST SURGERY  1997   rt lumpectomy    Family History  Problem Relation Age of Onset  . Breast cancer Mother 71   Social History:  reports that she has never smoked. She has never used smokeless tobacco. She reports current alcohol use of about 14.0 standard drinks of alcohol per week. She reports that she does not use drugs.  Allergies: No Known Allergies  Medications Prior to Admission  Medication Sig Dispense Refill  . beta carotene w/minerals (OCUVITE) tablet Take 1 tablet by mouth daily.    . hydrochlorothiazide (HYDRODIURIL) 25 MG tablet Take 25 mg by mouth daily.    Marland Kitchen losartan (COZAAR) 100 MG tablet Take 100 mg by mouth daily.    Marland Kitchen venlafaxine XR (EFFEXOR-XR) 150 MG 24 hr capsule Take 150 mg by mouth daily.      Results for orders placed or performed during the hospital encounter of 02/22/20 (from the past 48 hour(s))  Basic metabolic panel     Status: Abnormal   Collection Time: 02/21/20 11:50 AM  Result Value Ref Range   Sodium 138 135 - 145 mmol/L   Potassium 4.1 3.5 - 5.1 mmol/L   Chloride 102 98 - 111 mmol/L   CO2 26 22 - 32 mmol/L   Glucose, Bld 120 (H) 70 - 99 mg/dL    Comment: Glucose reference range applies only to samples taken after fasting for at least 8 hours.   BUN 13 6 - 20 mg/dL   Creatinine, Ser 0.83 0.44 - 1.00 mg/dL   Calcium 9.3 8.9 - 10.3 mg/dL   GFR calc non Af Amer >60 >60 mL/min   GFR calc Af Amer >60 >60 mL/min   Anion gap 10 5 - 15    Comment: Performed at [facility] [address], [place], [place] [address]   No results found.  Review of Systems  Constitutional: Negative.   HENT: Negative.   Eyes: Negative.   Respiratory: Negative.   Cardiovascular: Negative.   Gastrointestinal: Negative.   Endocrine: Negative.   Genitourinary: Negative.   Musculoskeletal: Negative.   Skin: Positive for color change and wound.  Psychiatric/Behavioral: Negative.     Height 5' 8.5" (1.74 m), weight 70.3 kg, last menstrual period 01/19/2020. Physical Exam  Constitutional: She is oriented to person, place, and time. She appears well-developed and well-nourished.  HENT:  Head: Normocephalic.  Nose:    Eyes: Pupils are equal, round, and reactive to light. EOM are normal.  Respiratory: Effort normal.  GI: Soft.  Musculoskeletal:        General: Normal range of motion.  Neurological: She is alert and oriented to person, place, and time.  Skin: Skin is warm.  Psychiatric: She has a normal mood and affect. Her behavior is normal. Thought content normal.     Assessment/Plan Plan for closure of nasal defect from the Mohs resection.  We will plan to close primarily.  May need  rotational flap or skin graft.  Risks and complications were explained to the patient and include scar, bleeding, pain and need for further revision.  [place], DO 02/22/2020, 2:23 PM

## 2020-02-22 NOTE — Op Note (Signed)
DATE OF OPERATION: 02/22/2020  LOCATION: [facility]  PREOPERATIVE DIAGNOSIS: nose defect after Mohs excisioin  POSTOPERATIVE DIAGNOSIS: Same  PROCEDURE: Primary closure of nose defect after mohs excision for Basal cell carcinoma 1 x 3 cm  SURGEON: Gaspard Isbell Sanger Bensyn Bornemann, DO  ASSISTANT: Roetta Sessions, PA  EBL: 1 cc  CONDITION: Stable  COMPLICATIONS: None  INDICATION: The patient, Felicia Coleman, is a 54 y.o. 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   PROCEDURE DETAILS:  The patient was seen prior to surgery and marked.  The IV antibiotics were given. The patient was taken to the operating room and given a general anesthetic. A standard time out was performed and all information was confirmed by those in the room. SCDs were placed.  The nose was prepped with Betadine and draped in the usual sterile fashion.  Lidocaine 1% with epinephrine was injected around the opening for intraoperative hemostasis and postop pain control.  Hemostasis was achieved with electrocautery.  A 4-0 Vicryl was used to close the deep layer.  The skin was closed with a running subcuticular 5-0 Monocryl.  Several Prolenes were placed for security since the area was tight.  Steri-Strips were applied.  The opening was 1 x 3 cm in size. The patient was allowed to wake up and taken to recovery room in stable condition at the end of the case. The family was notified at the end of the case.   The advanced practice practitioner (APP) assisted throughout the case.  The APP was essential in retraction and counter traction when needed to make the case progress smoothly.  This retraction and assistance made it possible to see the tissue plans for the procedure.  The assistance was needed for blood control, tissue re-approximation and assisted with closure of the incision site.  The [place] was signed into law in 2016 which  includes the topic of electronic health records.  This provides immediate access to information in MyChart.  This includes consultation notes, operative notes, office notes, lab results and pathology reports.  If you have any questions about what you read please let us know at your next visit or call us at the office.  We are right here with you.

## 2020-02-22 NOTE — Transfer of Care (Signed)
Immediate Anesthesia Transfer of Care Note  Patient: Felicia Coleman  Procedure(s) Performed: ADJACENT TISSUE TRANSFER/TISSUE REARRANGEMENT; CLOSURE OF MOHS DEFECT (N/A Nose)  Patient Location: PACU  Anesthesia Type:General  Level of Consciousness: awake, alert  and oriented  Airway & Oxygen Therapy: Patient Spontanous Breathing and Patient connected to face mask oxygen  Post-op Assessment: Report given to RN and Post -op Vital signs reviewed and stable  Post vital signs: Reviewed and stable  Last Vitals:  Vitals Value Taken Time  BP 131/86 02/22/20 1526  Temp    Pulse 84 02/22/20 1529  Resp 15 02/22/20 1529  SpO2 100 % 02/22/20 1529  Vitals shown include unvalidated device data.  Last Pain:  Vitals:   02/22/20 1422  TempSrc: Tympanic  PainSc: 4          Complications: No apparent anesthesia complications

## 2020-02-22 NOTE — Anesthesia Postprocedure Evaluation (Signed)
Anesthesia Post Note  Patient: Felicia Coleman  Procedure(s) Performed: ADJACENT TISSUE TRANSFER/TISSUE REARRANGEMENT; CLOSURE OF MOHS DEFECT (N/A Nose)     Patient location during evaluation: PACU Anesthesia Type: General Level of consciousness: awake and alert Pain management: pain level controlled Vital Signs Assessment: post-procedure vital signs reviewed and stable Respiratory status: spontaneous breathing, nonlabored ventilation and respiratory function stable Cardiovascular status: blood pressure returned to baseline and stable Postop Assessment: no apparent nausea or vomiting Anesthetic complications: no    Last Vitals:  Vitals:   02/22/20 1545 02/22/20 1600  BP: 128/78   Pulse: 74 76  Resp: (!) 26 15  Temp:    SpO2: 98% 99%    Last Pain:  Vitals:   02/22/20 1600  TempSrc:   PainSc: [place]

## 2020-02-23 ENCOUNTER — Encounter: Payer: Self-pay | Admitting: *Deleted

## 2020-02-29 ENCOUNTER — Other Ambulatory Visit: Payer: Self-pay

## 2020-02-29 ENCOUNTER — Ambulatory Visit (INDEPENDENT_AMBULATORY_CARE_PROVIDER_SITE_OTHER): Payer: BC Managed Care – PPO | Admitting: Plastic Surgery

## 2020-02-29 ENCOUNTER — Encounter: Payer: Self-pay | Admitting: Plastic Surgery

## 2020-02-29 VITALS — BP 123/86 | HR 80 | Temp 97.5°F | Ht 69.0 in | Wt 155.0 lb

## 2020-02-29 DIAGNOSIS — C44311 Basal cell carcinoma of skin of nose: Secondary | ICD-10-CM

## 2020-02-29 MED ORDER — AMOXICILLIN 500 MG PO CAPS
500.0000 mg | ORAL_CAPSULE | Freq: Three times a day (TID) | ORAL | 0 refills | Status: AC
Start: 2020-02-29 — End: 2020-03-03

## 2020-02-29 NOTE — Progress Notes (Signed)
The patient is doing well from her mohs excision.  We closed her primarily.  She had to wear a mask all week which has irritated the area significantly.  Sutures removed.  Recommend antibiotic ointment daily and oral abx.  Will send to pharmacy.  Avoid direct contact with mask. Follow up in one week.

## 2020-03-06 ENCOUNTER — Other Ambulatory Visit: Payer: Self-pay

## 2020-03-06 ENCOUNTER — Ambulatory Visit (INDEPENDENT_AMBULATORY_CARE_PROVIDER_SITE_OTHER): Payer: BC Managed Care – PPO | Admitting: Plastic Surgery

## 2020-03-06 ENCOUNTER — Encounter: Payer: Self-pay | Admitting: Plastic Surgery

## 2020-03-06 VITALS — BP 125/84 | HR 74 | Temp 98.2°F

## 2020-03-06 DIAGNOSIS — C44311 Basal cell carcinoma of skin of nose: Secondary | ICD-10-CM

## 2020-03-06 NOTE — Progress Notes (Signed)
The patient is a 54 year old female here for follow-up on her nose.  She had a Mohs resection of a basal cell carcinoma.  This was repaired primarily.  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  I think this irritated her nose and there was some concern about healing.  She put antibiotic ointment on it for the past week and was really good about keeping the mask off of that.  I think this made a really nice difference and it looks much better today.  I like her to use Vaseline and a Band-Aid for the next week and then I would like to take a look at it.  Hopefully she will be ready for some skin Uva and then in 6 weeks possibly some laser. The patient agrees with the plan.

## 2020-03-13 ENCOUNTER — Ambulatory Visit (INDEPENDENT_AMBULATORY_CARE_PROVIDER_SITE_OTHER): Payer: BC Managed Care – PPO | Admitting: Surgical

## 2020-03-13 ENCOUNTER — Other Ambulatory Visit: Payer: Self-pay

## 2020-03-13 ENCOUNTER — Encounter: Payer: Self-pay | Admitting: Plastic Surgery

## 2020-03-13 VITALS — BP 122/80 | HR 73 | Temp 97.7°F | Ht 69.0 in | Wt 155.0 lb

## 2020-03-13 DIAGNOSIS — Z719 Counseling, unspecified: Secondary | ICD-10-CM

## 2020-03-13 DIAGNOSIS — C44311 Basal cell carcinoma of skin of nose: Secondary | ICD-10-CM

## 2020-03-13 NOTE — Progress Notes (Signed)
Patient is a 54 year old female here for follow-up after Mohs resection of a basal cell carcinoma which was repaired primarily by Dr. Marla Roe.  She has some irritation of her nose, likely related to wearing a mask, there is no sign of any infection.   Patient is doing better today, she is little anxious about the future outcome of the incision and how it is going to heal.  I reassured her that with time it will improve and with scar cream and potentially laser she will have improved results, it is still very early.  Recommend beginning the use of skinuva scar cream in 1 week and following up in approximately 6 weeks for possible laser depending on improvement.  Patient in agreement with current plan.

## 2020-05-01 ENCOUNTER — Ambulatory Visit (INDEPENDENT_AMBULATORY_CARE_PROVIDER_SITE_OTHER): Payer: BC Managed Care – PPO | Admitting: Plastic Surgery

## 2020-05-01 ENCOUNTER — Encounter: Payer: Self-pay | Admitting: Plastic Surgery

## 2020-05-01 ENCOUNTER — Other Ambulatory Visit: Payer: Self-pay

## 2020-05-01 VITALS — BP 127/86 | HR 87 | Temp 97.8°F

## 2020-05-01 DIAGNOSIS — D2230 Melanocytic nevi of unspecified part of face: Secondary | ICD-10-CM

## 2020-05-01 DIAGNOSIS — C44311 Basal cell carcinoma of skin of nose: Secondary | ICD-10-CM

## 2020-05-01 NOTE — Progress Notes (Signed)
Preoperative Dx: redness of nose after mohs reconstruction  Postoperative Dx:  same  Procedure: laser to nose   Anesthesia: none  Description of Procedure:  Risks and complications were explained to the patient. Consent was confirmed and signed. Time out was called and all information was confirmed to be correct. The area  area was prepped with alcohol and wiped dry. The IPL laser was set at 7.0 J/cm2. The nose was lasered. The patient tolerated the procedure well and there were no complications. The patient is to follow up in 4 weeks.

## 2020-07-10 ENCOUNTER — Other Ambulatory Visit: Payer: BC Managed Care – PPO | Admitting: Plastic Surgery

## 2020-12-19 IMAGING — MG MM DIGITAL DIAGNOSTIC UNILAT*L* W/ TOMO
8 series · 9 of 24 positions shown · non-contrast
Comparison: Previous exam(s).

ACR Breast Density Category  choose

CLINICAL DATA: Patient was called back from screening mammogram for
2 areas of possible distortion in the left breast.

EXAM:
DIGITAL DIAGNOSTIC UNILATERAL LEFT MAMMOGRAM WITH CAD AND TOMO

[L MLO synth-2D (1 of 3)]
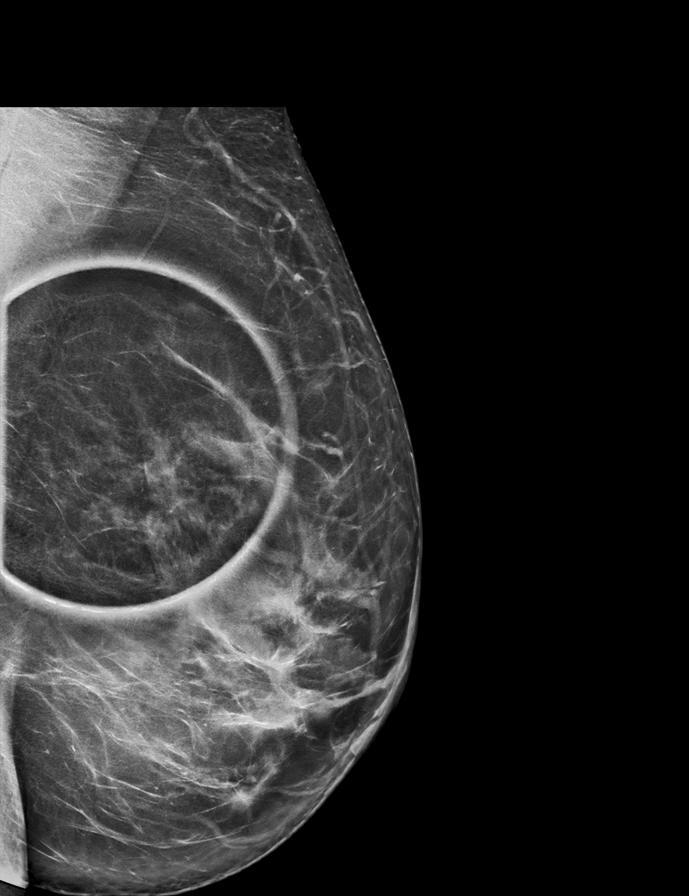

[L ML synth-2D]
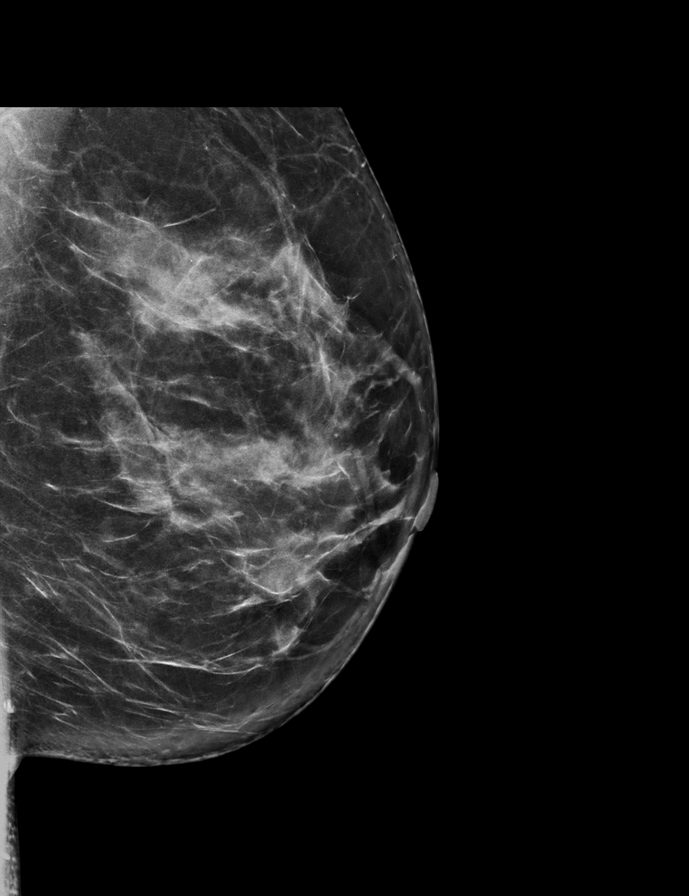

[L MLO synth-2D (2 of 3)]
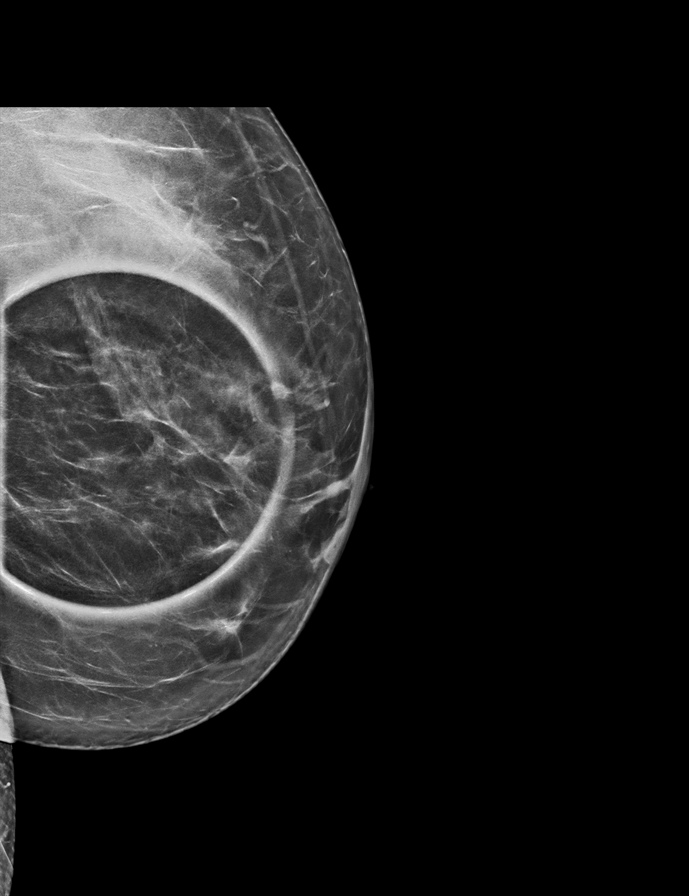

[L MLO synth-2D (3 of 3)]
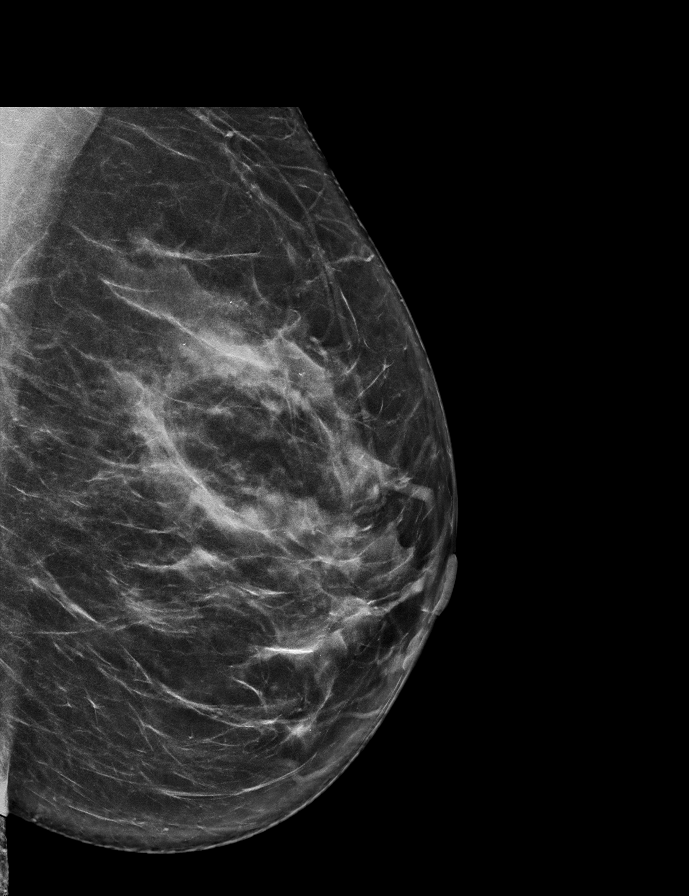

[L MLO tomo · 2 of 68 frames shown (1 of 3)]
[frame 22/68]
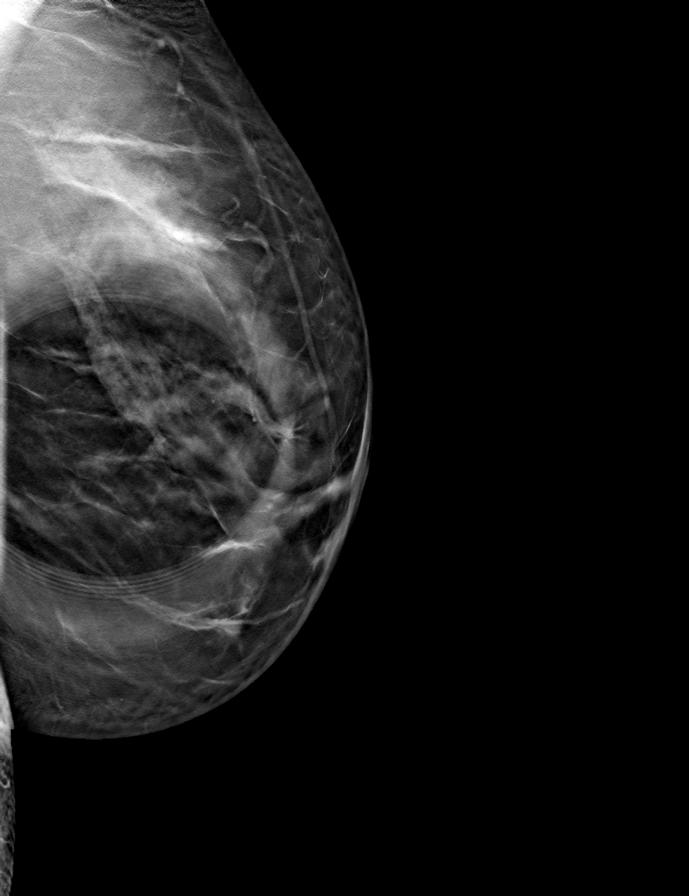
[frame 35/68]
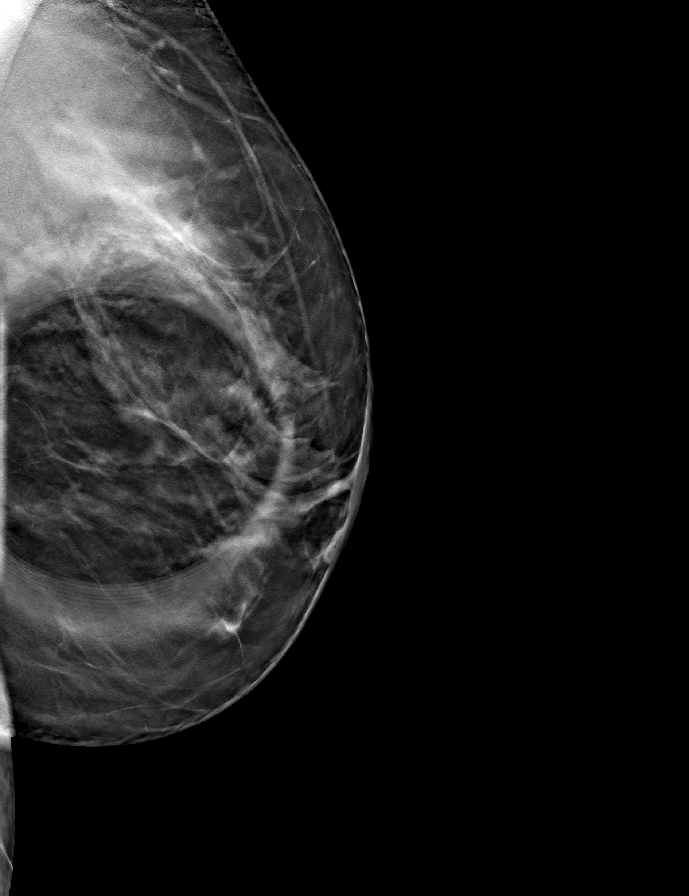

[L ML tomo · tomo slice 38/75.0]
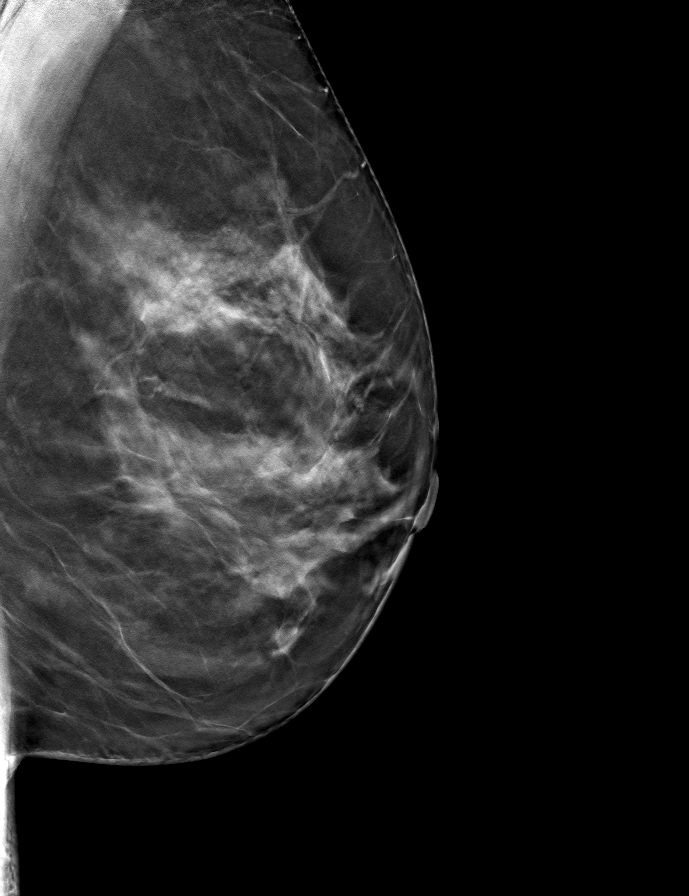

[L MLO tomo (2 of 3) · tomo slice 38/75.0]
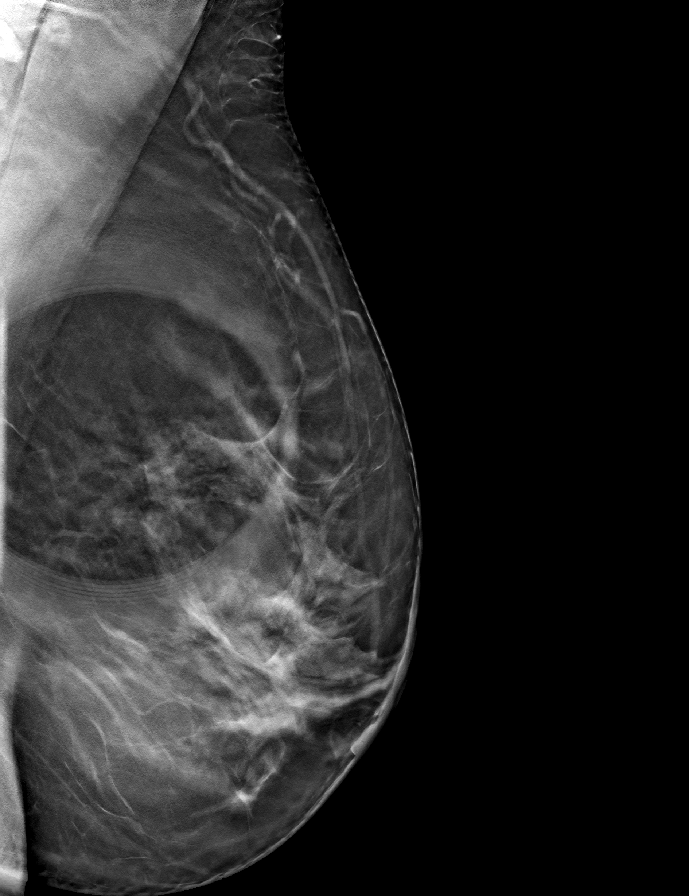

[L MLO tomo (3 of 3) · tomo slice 37/73.0]
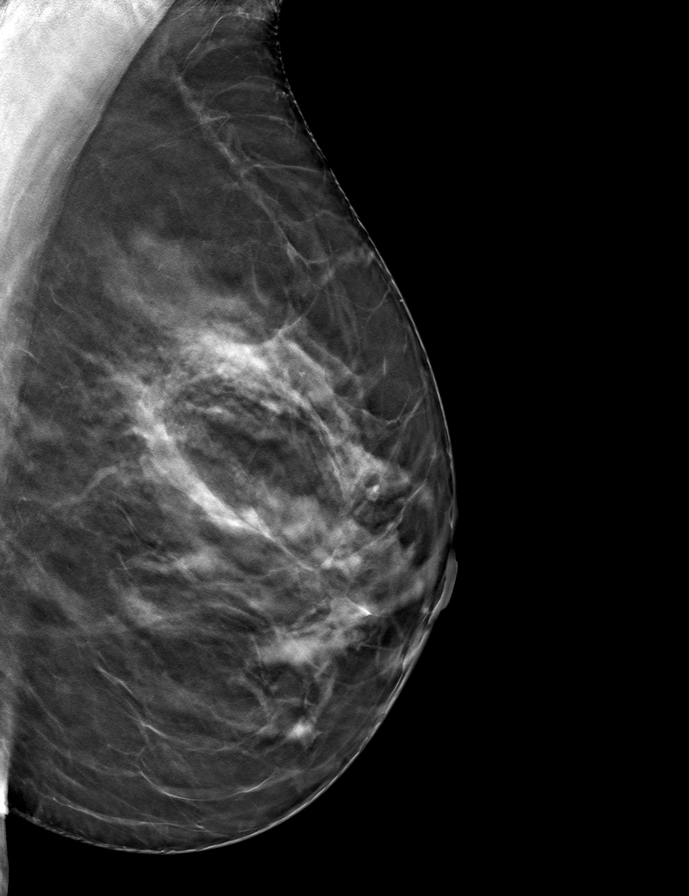

[9 of 24 positions shown; findings below may reference images not displayed]

FINDINGS: Additional imaging of the left breast was performed. No persistent
distortion, mass or malignant type microcalcifications identified.

Mammographic images were processed with CAD.
IMPRESSION: No evidence of malignancy in the left breast.

RECOMMENDATION:
Bilateral screening mammogram in 1 year is recommended.

I have discussed the findings and recommendations with the patient.
If applicable, a reminder letter will be sent to the patient
regarding the next appointment.

BI-RADS CATEGORY  1: Negative.
